# Patient Record
Sex: Male | Born: 1995 | Race: Black or African American | Hispanic: No | Marital: Single | Smoking: Never smoker
Health system: Southern US, Community
[De-identification: ages and names within clinical notes are randomized; demographics above are authoritative.]

---

## 2019-02-16 ENCOUNTER — Emergency Department (HOSPITAL_COMMUNITY): Payer: Medicaid Other

## 2019-02-16 ENCOUNTER — Other Ambulatory Visit: Payer: Self-pay

## 2019-02-16 ENCOUNTER — Emergency Department (HOSPITAL_COMMUNITY)
Admission: EM | Admit: 2019-02-16 | Discharge: 2019-02-16 | Disposition: A | Discharge status: Home / Self Care | Payer: Medicaid Other | Attending: Emergency Medicine | Admitting: Emergency Medicine

## 2019-02-16 ENCOUNTER — Encounter (HOSPITAL_COMMUNITY): Payer: Self-pay

## 2019-02-16 DIAGNOSIS — Y999 Unspecified external cause status: Secondary | ICD-10-CM | POA: Diagnosis not present

## 2019-02-16 DIAGNOSIS — S299XXA Unspecified injury of thorax, initial encounter: Secondary | ICD-10-CM | POA: Diagnosis present

## 2019-02-16 DIAGNOSIS — W1789XA Other fall from one level to another, initial encounter: Secondary | ICD-10-CM | POA: Insufficient documentation

## 2019-02-16 DIAGNOSIS — Y93H3 Activity, building and construction: Secondary | ICD-10-CM | POA: Insufficient documentation

## 2019-02-16 DIAGNOSIS — Y92009 Unspecified place in unspecified non-institutional (private) residence as the place of occurrence of the external cause: Secondary | ICD-10-CM | POA: Insufficient documentation

## 2019-02-16 DIAGNOSIS — S20212A Contusion of left front wall of thorax, initial encounter: Secondary | ICD-10-CM | POA: Diagnosis not present

## 2019-02-16 MED ORDER — IBUPROFEN 400 MG PO TABS
600.0000 mg | ORAL_TABLET | Freq: Once | ORAL | Status: AC
  Administered 2019-02-16: 600 mg via ORAL
  Filled 2019-02-16: qty 1

## 2019-02-16 MED ORDER — IBUPROFEN 600 MG PO TABS: 600.0000 mg | ORAL_TABLET | Freq: Three times a day (TID) | ORAL | 0 refills | Status: DC | PRN

## 2019-02-16 NOTE — ED Notes (Signed)
Pt verbalized understanding of discharge paperwork and prescriptions.  °

## 2019-02-16 NOTE — Discharge Instructions (Addendum)
Take the medications as needed for pain, follow-up with a primary care doctor as needed

## 2019-02-16 NOTE — ED Notes (Signed)
Patient transported to X-ray 

## 2019-02-16 NOTE — ED Provider Notes (Signed)
[facility]   CSN: [id number] Arrival date & time: 02/16/19  0901    History   Chief Complaint Chief Complaint  Patient presents with  . Fall    HPI Alan Avery is a 23 y.o. male.     HPI Patient presents emergency room for evaluation of left-sided chest pain after fall.  Patient states he was working on a home improvement yesterday.  He was about 1 story up when he fell onto the ground landing on his left side.   Pt denies LOC or head injury.  This am he started having pain on his left anterior chest.  Patient states it hurts to palpate that area and also deep breath.  Also hurts when he moves his arm.  He denies any other injuries.  No difficulty with fevers or chills.  No cough.  No abdominal pain. History reviewed. No pertinent past medical history.  There are no active problems to display for this patient.   History reviewed. No pertinent surgical history.      Home Medications    Prior to Admission medications   Medication Sig Start Date End Date Taking? Authorizing Provider  ibuprofen (ADVIL) 600 MG tablet Take 1 tablet (600 mg total) by mouth every 8 (eight) hours as needed. 02/16/19   Linwood DibblesKnapp, Jahira Swiss, MD    Family History History reviewed. No pertinent family history.  Social History Social History   Tobacco Use  . Smoking status: Not on file  Substance Use Topics  . Alcohol use: Not on file  . Drug use: Not on file     Allergies   Patient has no known allergies.   Review of Systems Review of Systems  All other systems reviewed and are negative.    Physical Exam Updated Vital Signs BP 132/65 (BP Location: Right Arm)   Pulse (!) 49   Temp 98.4 F (36.9 C) (Oral)   Resp 18   SpO2 100%   Physical Exam Vitals signs and nursing note reviewed.  Constitutional:      General: He is not in acute distress.    Appearance: He is well-developed.  HENT:     Head: Normocephalic and atraumatic.   Right Ear: External ear normal.     Left Ear: External ear normal.  Eyes:     General: No scleral icterus.       Right eye: No discharge.        Left eye: No discharge.     Conjunctiva/sclera: Conjunctivae normal.  Neck:     Musculoskeletal: Neck supple.     Trachea: No tracheal deviation.  Cardiovascular:     Rate and Rhythm: Normal rate and regular rhythm.  Pulmonary:     Effort: Pulmonary effort is normal. No respiratory distress.     Breath sounds: Normal breath sounds. No stridor. No wheezing or rales.     Comments: Tenderness palpation left anterior chest wall, no crepitus, no deformity, no external injuries noted Chest:     Chest wall: Tenderness present.  Abdominal:     General: Bowel sounds are normal. There is no distension.     Palpations: Abdomen is soft.     Tenderness: There is no abdominal tenderness. There is no guarding or rebound.  Musculoskeletal:        General: No tenderness.  Skin:    General: Skin is warm and dry.     Findings: No rash.  Neurological:     Mental Status: He  is alert.     Cranial Nerves: No cranial nerve deficit (no facial droop, extraocular movements intact, no slurred speech).     Sensory: No sensory deficit.     Motor: No abnormal muscle tone or seizure activity.     Coordination: Coordination normal.      ED Treatments / Results  Labs (all labs ordered are listed, but only abnormal results are displayed) Labs Reviewed - No data to display  EKG EKG Interpretation  Date/Time:  Tuesday February 16 2019 09:10:28 EDT Ventricular Rate:  59 PR Interval:    QRS Duration: 92 QT Interval:  423 QTC Calculation: 419 R Axis:   84 Text Interpretation:  Sinus arrhythmia ST elev, probable normal early repol pattern No old tracing to compare Confirmed by Dorie Rank [phone]) on 02/16/2019 9:14:45 AM   Radiology Dg Ribs Unilateral W/chest Left  Result Date: 02/16/2019 CLINICAL DATA:  Pain following fall EXAM: LEFT RIBS AND CHEST - 3+ VIEW  COMPARISON:  None. FINDINGS: Frontal chest as well as oblique and cone-down rib images were obtained. Lungs are clear. Heart size and pulmonary vascularity are normal. No adenopathy. There is no demonstrable rib fracture. No pneumothorax or pleural effusion. IMPRESSION: No evident rib fracture.  Lungs clear. Electronically Signed   By: Lowella Grip III M.D.   On: 02/16/2019 09:35    Procedures Procedures (including critical care time)  Medications Ordered in ED Medications  ibuprofen (ADVIL) tablet 600 mg (600 mg Oral Given 02/16/19 6754)     Initial Impression / Assessment and Plan / ED Course  I have reviewed the triage vital signs and the nursing notes.  Pertinent labs & imaging results that were available during my care of the patient were reviewed by me and considered in my medical decision making (see chart for details).   EKG and chest x-ray are reassuring.  Most likely a chest wall contusion.  Discharged home with prescription for ibuprofen.  Warning signs and precautions discussed. Final Clinical Impressions(s) / ED Diagnoses   Final diagnoses:  Contusion of left chest wall, initial encounter    ED Discharge Orders         Ordered    ibuprofen (ADVIL) 600 MG tablet  Every 8 hours PRN     02/16/19 1026           Dorie Rank, MD 02/16/19 1028

## 2019-02-16 NOTE — ED Triage Notes (Signed)
Patient fell on chest yesterday

## 2019-10-26 ENCOUNTER — Other Ambulatory Visit: Payer: Self-pay

## 2019-10-26 ENCOUNTER — Encounter (HOSPITAL_COMMUNITY): Payer: Self-pay | Admitting: Emergency Medicine

## 2019-10-26 ENCOUNTER — Emergency Department (HOSPITAL_COMMUNITY)
Admission: EM | Admit: 2019-10-26 | Discharge: 2019-10-26 | Disposition: A | Discharge status: Home / Self Care | Payer: Medicaid Other | Attending: Emergency Medicine | Admitting: Emergency Medicine

## 2019-10-26 DIAGNOSIS — W268XXA Contact with other sharp object(s), not elsewhere classified, initial encounter: Secondary | ICD-10-CM | POA: Diagnosis not present

## 2019-10-26 DIAGNOSIS — Y929 Unspecified place or not applicable: Secondary | ICD-10-CM | POA: Insufficient documentation

## 2019-10-26 DIAGNOSIS — Y93G1 Activity, food preparation and clean up: Secondary | ICD-10-CM | POA: Diagnosis not present

## 2019-10-26 DIAGNOSIS — Y998 Other external cause status: Secondary | ICD-10-CM | POA: Insufficient documentation

## 2019-10-26 DIAGNOSIS — S6992XA Unspecified injury of left wrist, hand and finger(s), initial encounter: Secondary | ICD-10-CM | POA: Diagnosis present

## 2019-10-26 DIAGNOSIS — S61412A Laceration without foreign body of left hand, initial encounter: Secondary | ICD-10-CM | POA: Diagnosis not present

## 2019-10-26 MED ORDER — CEPHALEXIN 500 MG PO CAPS: 500.0000 mg | ORAL_CAPSULE | Freq: Four times a day (QID) | ORAL | 0 refills | Status: DC

## 2019-10-26 MED ORDER — LIDOCAINE-EPINEPHRINE (PF) 2 %-1:200000 IJ SOLN
10.0000 mL | Freq: Once | INTRAMUSCULAR | Status: AC
  Administered 2019-10-26: 10 mL via INTRADERMAL
  Filled 2019-10-26: qty 20

## 2019-10-26 MED ORDER — BACITRACIN ZINC 500 UNIT/GM EX OINT: TOPICAL_OINTMENT | Freq: Every day | CUTANEOUS | Status: DC

## 2019-10-26 NOTE — Discharge Instructions (Signed)
Please take the Keflex antibiotics, as prescribed.  I also encourage you to apply a topical antibiotic such as bacitracin or triple antibiotic that you can obtain at a pharmacy for improved cosmetic outcome.  Please keep the area dressed and clean.    Please read the attachment on laceration wound care as it provides great information on how to care for your sutures.  You will need to have them removed in 7 to 10 days either here in the ED or an urgent care or primary care provider.  Return to the ED or seek immediate medical attention should develop any fevers or chills, spreading redness or swelling, or any other new or worsening symptoms.

## 2019-10-26 NOTE — ED Notes (Signed)
Pt discharge instructions and prescriptions reviewed with the patient. The patient verbalized understanding of both. Pt discharged. 

## 2019-10-26 NOTE — ED Triage Notes (Signed)
Pt reports getting left hand cut on can opener last night. Bleeding controlled.
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
## 2020-06-05 IMAGING — DX LEFT RIBS AND CHEST - 3+ VIEW
3 series · 3 of 3 positions shown · non-contrast
Comparison: None.

CLINICAL DATA: Pain following fall

EXAM:
LEFT RIBS AND CHEST - 3+ VIEW

[chest pa]
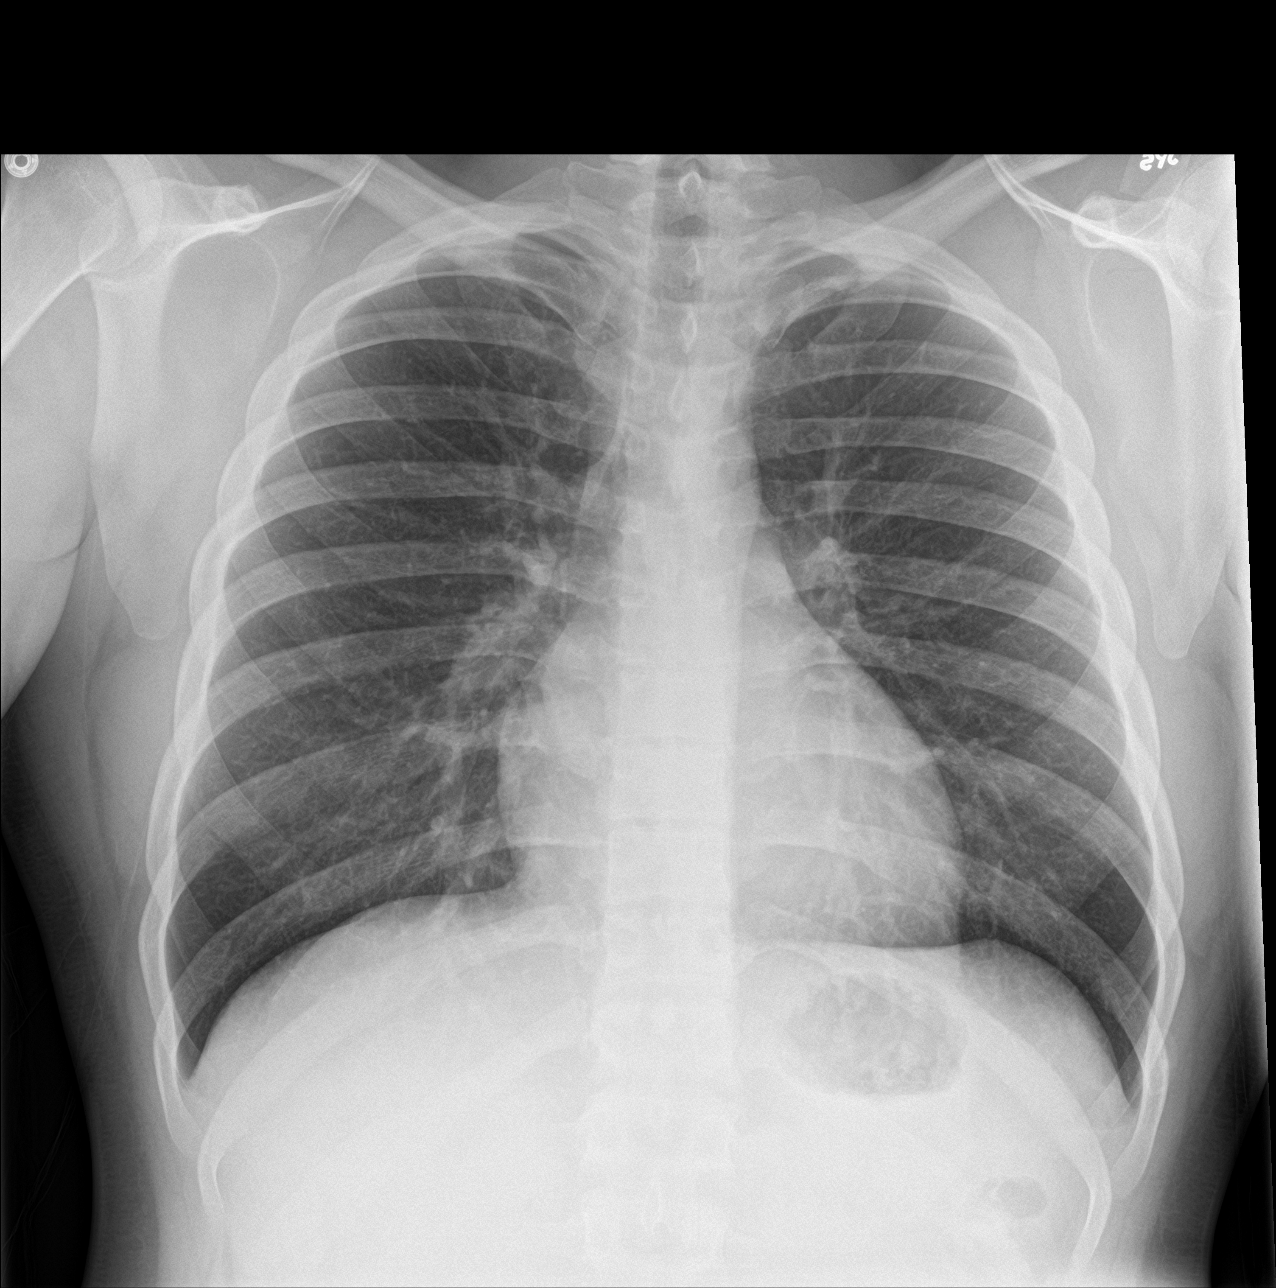

[rib pa obl]
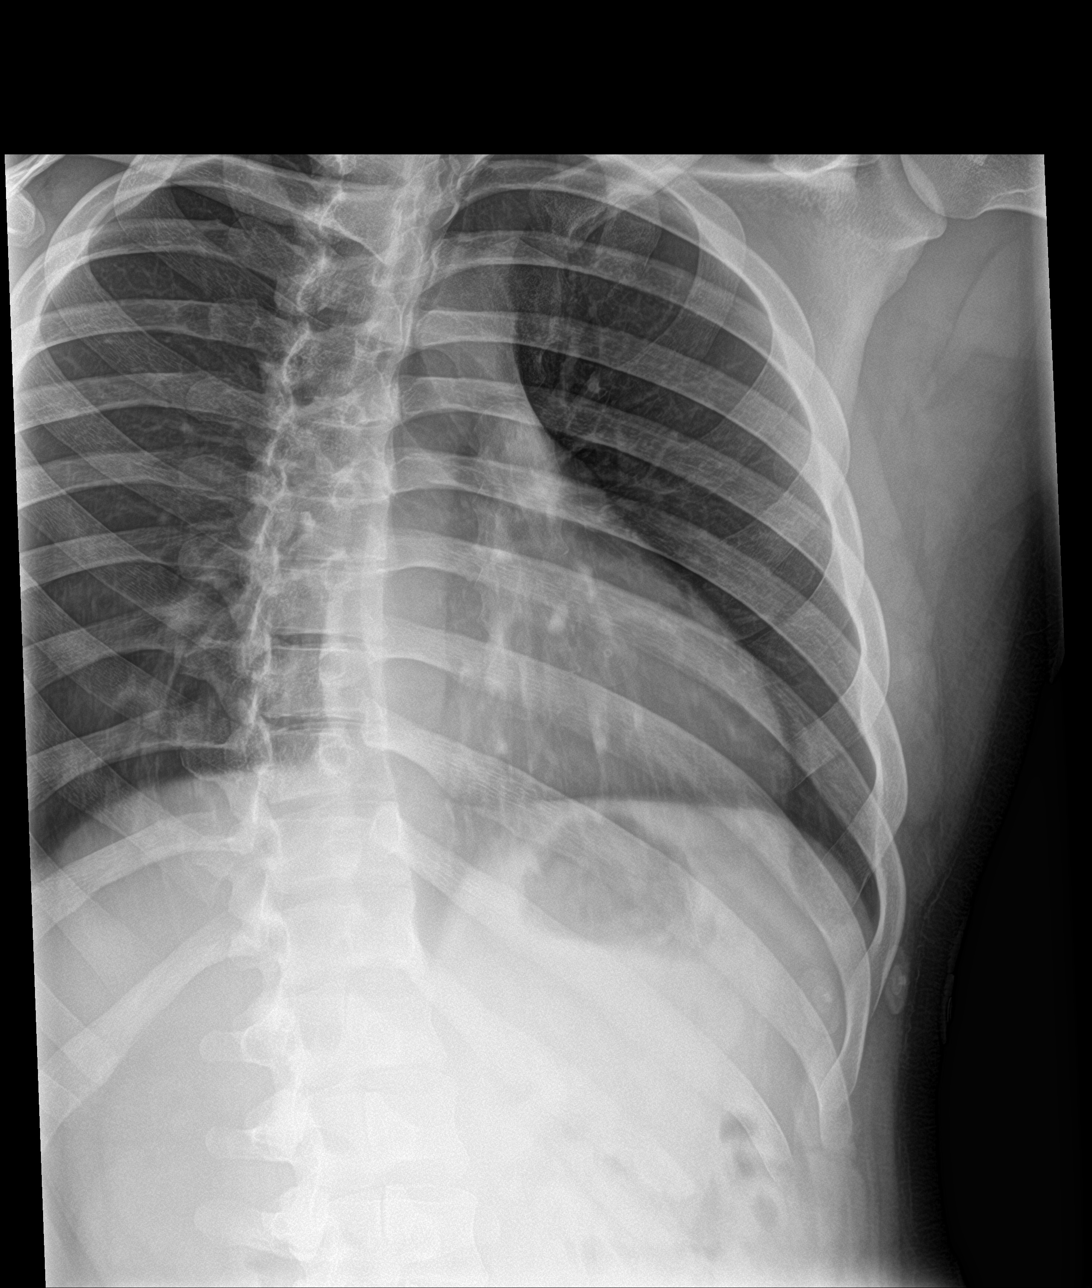

[rib pa]
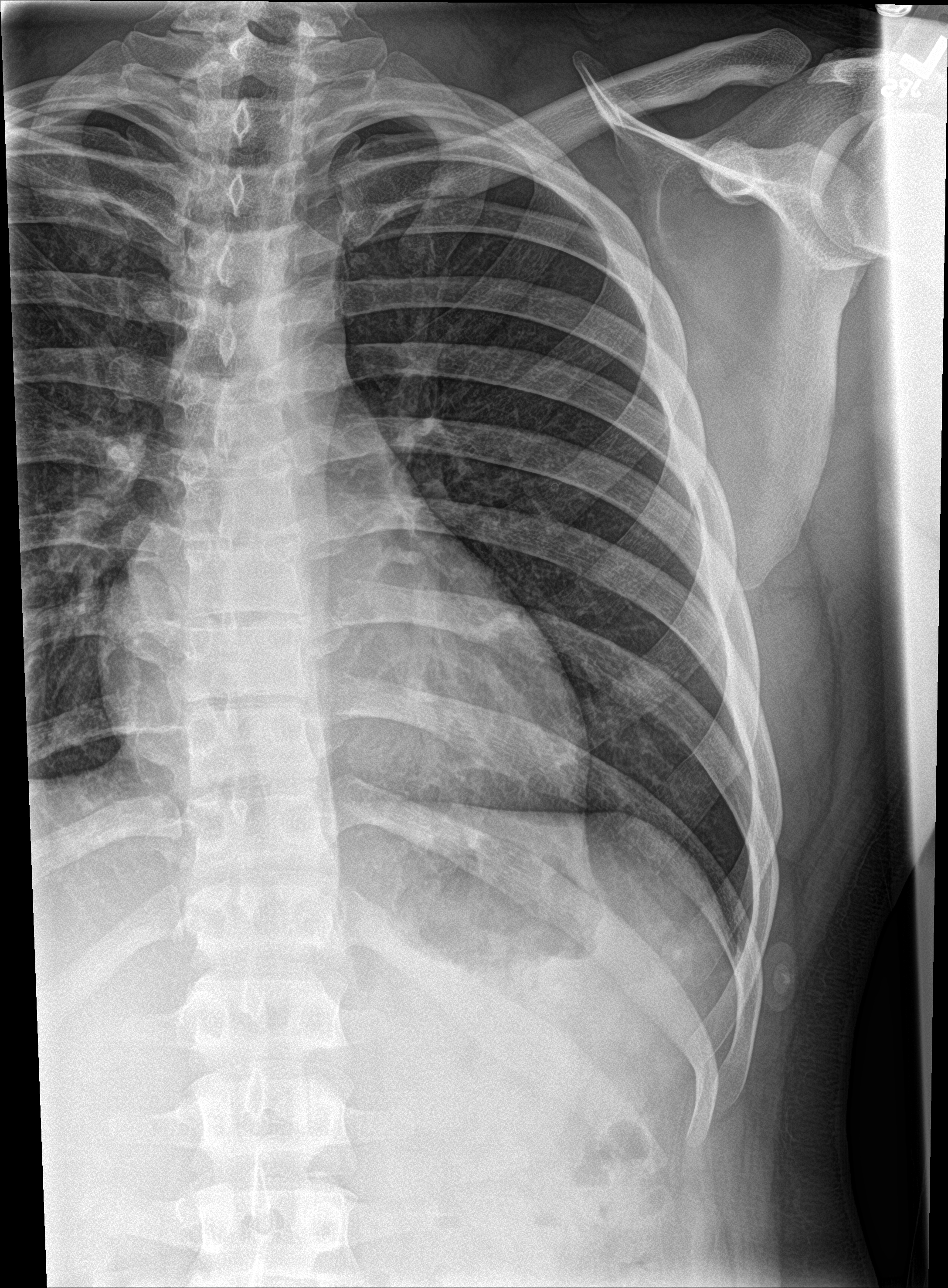

[3 of 3 positions shown; findings below may reference images not displayed]

FINDINGS: Frontal chest as well as oblique and cone-down rib images were
obtained. Lungs are clear. Heart size and pulmonary vascularity are
normal. No adenopathy.

There is no demonstrable rib fracture. No pneumothorax or pleural
effusion.
IMPRESSION: No evident rib fracture.  Lungs clear.

## 2023-08-15 ENCOUNTER — Encounter (HOSPITAL_COMMUNITY): Payer: Self-pay

## 2023-08-15 ENCOUNTER — Ambulatory Visit (HOSPITAL_COMMUNITY)
Admission: EM | Admit: 2023-08-15 | Discharge: 2023-08-15 | Disposition: A | Discharge status: Home / Self Care | Payer: Medicaid Other | Attending: Internal Medicine | Admitting: Internal Medicine

## 2023-08-15 DIAGNOSIS — K047 Periapical abscess without sinus: Secondary | ICD-10-CM | POA: Diagnosis present

## 2023-08-15 DIAGNOSIS — Z113 Encounter for screening for infections with a predominantly sexual mode of transmission: Secondary | ICD-10-CM | POA: Diagnosis present

## 2023-08-15 MED ORDER — AMOXICILLIN-POT CLAVULANATE 875-125 MG PO TABS: 1.0000 | ORAL_TABLET | Freq: Two times a day (BID) | ORAL | 0 refills | Status: AC

## 2023-08-15 MED ORDER — KETOROLAC TROMETHAMINE 10 MG PO TABS: 10.0000 mg | ORAL_TABLET | Freq: Three times a day (TID) | ORAL | 0 refills | Status: AC | PRN

## 2023-08-15 NOTE — ED Triage Notes (Signed)
Patient here to be tested for all STDs. Patient states that his partner told him that she feels like she may have BV.

## 2023-08-15 NOTE — ED Provider Notes (Signed)
[facility]    CSN: [id number] Arrival date & time: 08/15/23  1752      History   Chief Complaint Chief Complaint  Patient presents with   SEXUALLY TRANSMITTED DISEASE    HPI Tracey Gere is a 27 y.o. male.   27 year old male who presents to urgent care with complaints of possible STI exposure and also dental pain.  He reports that the mother of his children has been diagnosed with an STI and he would like to be checked.  He denies any drainage, penile pain, hematuria, dysuria or abdominal pain.  He also relates that he has been dealing with dental abscesses for several weeks now.  He has 1 on the right lower jaw and one on the left upper jaw.  These areas are very painful and he has been taking ibuprofen 800 mg several times daily without relief.  He thinks he may has an abscess at this point.  He denies fevers or chills.     History reviewed. No pertinent past medical history.  There are no active problems to display for this patient.   History reviewed. No pertinent surgical history.     Home Medications    Prior to Admission medications   Not on File    Family History History reviewed. No pertinent family history.  Social History Social History   Tobacco Use   Smoking status: Never   Smokeless tobacco: Never  Vaping Use   Vaping status: Never Used  Substance Use Topics   Alcohol use: Yes    Comment: occasionally   Drug use: Never     Allergies   Patient has no known allergies.   Review of Systems Review of Systems  Constitutional:  Negative for chills and fever.  HENT:  Positive for dental problem. Negative for ear pain and sore throat.   Eyes:  Negative for pain and visual disturbance.  Respiratory:  Negative for cough and shortness of breath.   Cardiovascular:  Negative for chest pain and palpitations.  Gastrointestinal:  Negative for abdominal pain and vomiting.  Genitourinary:  Negative for dysuria and hematuria.   Musculoskeletal:  Negative for arthralgias and back pain.  Skin:  Negative for color change and rash.  Neurological:  Negative for seizures and syncope.  All other systems reviewed and are negative.    Physical Exam Triage Vital Signs ED Triage Vitals [08/15/23 1811]  Encounter Vitals Group     BP 130/81     Systolic BP Percentile      Diastolic BP Percentile      Pulse Rate 71     Resp 16     Temp 98.6 F (37 C)     Temp Source Oral     SpO2 97 %     Weight 255 lb (115.7 kg)     Height 5\' 10"  (1.778 m)     Head Circumference      Peak Flow      Pain Score 0     Pain Loc      Pain Education      Exclude from Growth Chart    No data found.  Updated Vital Signs BP 130/81 (BP Location: Left Arm)   Pulse 71   Temp 98.6 F (37 C) (Oral)   Resp 16   Ht 5\' 10"  (1.778 m)   Wt 255 lb (115.7 kg)   SpO2 97%   BMI 36.59 kg/m   Visual Acuity Right Eye Distance:   Left Eye  Distance:   Bilateral Distance:    Right Eye Near:   Left Eye Near:    Bilateral Near:     Physical Exam Vitals and nursing note reviewed.  Constitutional:      General: He is not in acute distress.    Appearance: He is well-developed.  HENT:     Head: Normocephalic and atraumatic.     Mouth/Throat:   Eyes:     Conjunctiva/sclera: Conjunctivae normal.  Cardiovascular:     Rate and Rhythm: Normal rate and regular rhythm.     Heart sounds: No murmur heard. Pulmonary:     Effort: Pulmonary effort is normal. No respiratory distress.     Breath sounds: Normal breath sounds.  Abdominal:     Palpations: Abdomen is soft.     Tenderness: There is no abdominal tenderness.  Musculoskeletal:        General: No swelling.     Cervical back: Neck supple.  Skin:    General: Skin is warm and dry.     Capillary Refill: Capillary refill takes less than 2 seconds.  Neurological:     Mental Status: He is alert.  Psychiatric:        Mood and Affect: Mood normal.      UC Treatments / Results   Labs (all labs ordered are listed, but only abnormal results are displayed) Labs Reviewed  RPR  HIV ANTIBODY (ROUTINE TESTING W REFLEX)    EKG   Radiology No results found.  Procedures Procedures (including critical care time)  Medications Ordered in UC Medications - No data to display  Initial Impression / Assessment and Plan / UC Course  I have reviewed the triage vital signs and the nursing notes.  Pertinent labs & imaging results that were available during my care of the patient were reviewed by me and considered in my medical decision making (see chart for details).     Screening examination for STI  Dental abscess   Screening swab and blood work done today and results will be available in 24-24 hours. We will contact you if we need to arrange additional treatment based on your testing. Negative results will be on your MyChart account. Abstain from sex until you receive your final results.  Use a condom for sexual encounters. If you have any worsening or changing symptoms including abnormal discharge, pelvic pain, abdominal pain, fever, nausea, or vomiting, then you should be reevaluated.    Dental abscess on the right lower jaw and right upper jaw. This can be treated with the following:  Augmentin 875 mg twice daily for 7 days Ketorolac (toradol) 10 mg every 8 hours as needed for pain Make an appointment with a dentist as these teeth may need further treatment.  Return to urgent care or PCP if symptoms worsen or fail to resolve.    Final Clinical Impressions(s) / UC Diagnoses   Final diagnoses:  Screening examination for STI  Dental abscess     Discharge Instructions      Screening swab and blood work done today and results will be available in 24-24 hours. We will contact you if we need to arrange additional treatment based on your testing. Negative results will be on your MyChart account. Abstain from sex until you receive your final results.  Use a condom  for sexual encounters. If you have any worsening or changing symptoms including abnormal discharge, pelvic pain, abdominal pain, fever, nausea, or vomiting, then you should be reevaluated.    Dental  abscess on the right lower jaw and right upper jaw. This can be treated with the following:  Augmentin 875 mg twice daily for 7 days Make an appointment with a dentist as these teeth may need further treatment.  Return to urgent care or PCP if symptoms worsen or fail to resolve.     ED Prescriptions   None    PDMP not reviewed this encounter.   Landis Martins, [place] 08/15/23 1850

## 2023-08-15 NOTE — Discharge Instructions (Addendum)
Screening swab and blood work done today and results will be available in 24-24 hours. We will contact you if we need to arrange additional treatment based on your testing. Negative results will be on your MyChart account. Abstain from sex until you receive your final results.  Use a condom for sexual encounters. If you have any worsening or changing symptoms including abnormal discharge, pelvic pain, abdominal pain, fever, nausea, or vomiting, then you should be reevaluated.    Dental abscess on the right lower jaw and right upper jaw. This can be treated with the following:  Augmentin 875 mg twice daily for 7 days Ketorolac (toradol) 10 mg every 8 hours as needed for pain Make an appointment with a dentist as these teeth may need further treatment.  Return to urgent care or PCP if symptoms worsen or fail to resolve.

## 2023-08-15 NOTE — ED Notes (Addendum)
Pt left facility before lab work could be collected. Madlyn Frankel CMA called pt to ask if he would like to return for blood work to be completed. Pt denied stating "I am good without it." Lanora Manis PA made aware.

## 2023-08-18 LAB — CYTOLOGY, (ORAL, ANAL, URETHRAL) ANCILLARY ONLY
Chlamydia: NEGATIVE
Comment: NEGATIVE
Comment: NEGATIVE
Comment: NORMAL
Neisseria Gonorrhea: NEGATIVE
Trichomonas: NEGATIVE
